# Patient Record
Sex: Male | Born: 1950 | Hispanic: Yes | Smoking: Never smoker
Health system: Southern US, Community
[De-identification: ages and names within clinical notes are randomized; demographics above are authoritative.]

## PROBLEM LIST (undated history)

## (undated) DIAGNOSIS — C801 Malignant (primary) neoplasm, unspecified: Secondary | ICD-10-CM

## (undated) DIAGNOSIS — K219 Gastro-esophageal reflux disease without esophagitis: Secondary | ICD-10-CM

## (undated) DIAGNOSIS — E039 Hypothyroidism, unspecified: Secondary | ICD-10-CM

## (undated) DIAGNOSIS — M1990 Unspecified osteoarthritis, unspecified site: Secondary | ICD-10-CM

## (undated) HISTORY — PX: PORTACATH PLACEMENT: SHX2246

---

## 2009-01-07 ENCOUNTER — Ambulatory Visit (HOSPITAL_COMMUNITY): Admission: RE | Admit: 2009-01-07 | Discharge: 2009-01-07 | Payer: Self-pay | Admitting: Otolaryngology

## 2009-01-14 ENCOUNTER — Ambulatory Visit: Admission: RE | Admit: 2009-01-14 | Discharge: 2009-04-05 | Payer: Self-pay | Admitting: Radiation Oncology

## 2009-01-29 ENCOUNTER — Ambulatory Visit: Payer: Self-pay | Admitting: Internal Medicine

## 2009-01-29 LAB — CBC WITH DIFFERENTIAL/PLATELET
BASO%: 1.1 % (ref 0.0–2.0)
Basophils Absolute: 0.1 10*3/uL (ref 0.0–0.1)
EOS%: 2.4 % (ref 0.0–7.0)
HCT: 44.7 % (ref 38.4–49.9)
HGB: 15.3 g/dL (ref 13.0–17.1)
MCH: 31.5 pg (ref 27.2–33.4)
MONO#: 0.8 10*3/uL (ref 0.1–0.9)
NEUT%: 37.9 % — ABNORMAL LOW (ref 39.0–75.0)
RDW: 14.1 % (ref 11.0–14.6)
WBC: 7.2 10*3/uL (ref 4.0–10.3)
lymph#: 3.4 10*3/uL — ABNORMAL HIGH (ref 0.9–3.3)

## 2009-01-29 LAB — COMPREHENSIVE METABOLIC PANEL
Alkaline Phosphatase: 133 U/L — ABNORMAL HIGH (ref 39–117)
CO2: 28 mEq/L (ref 19–32)
Creatinine, Ser: 0.66 mg/dL (ref 0.40–1.50)
Glucose, Bld: 89 mg/dL (ref 70–99)
Sodium: 131 mEq/L — ABNORMAL LOW (ref 135–145)
Total Bilirubin: 0.7 mg/dL (ref 0.3–1.2)
Total Protein: 8.6 g/dL — ABNORMAL HIGH (ref 6.0–8.3)

## 2009-01-29 LAB — LACTATE DEHYDROGENASE: LDH: 210 U/L (ref 94–250)

## 2009-02-04 LAB — COMPREHENSIVE METABOLIC PANEL
ALT: 140 U/L — ABNORMAL HIGH (ref 0–53)
AST: 136 U/L — ABNORMAL HIGH (ref 0–37)
Albumin: 3.4 g/dL — ABNORMAL LOW (ref 3.5–5.2)
Alkaline Phosphatase: 142 U/L — ABNORMAL HIGH (ref 39–117)
BUN: 18 mg/dL (ref 6–23)
CO2: 25 mEq/L (ref 19–32)
Calcium: 9.1 mg/dL (ref 8.4–10.5)
Chloride: 102 mEq/L (ref 96–112)
Creatinine, Ser: 0.74 mg/dL (ref 0.40–1.50)
Glucose, Bld: 79 mg/dL (ref 70–99)
Potassium: 4.8 mEq/L (ref 3.5–5.3)
Sodium: 138 mEq/L (ref 135–145)
Total Bilirubin: 0.5 mg/dL (ref 0.3–1.2)
Total Protein: 7.8 g/dL (ref 6.0–8.3)

## 2009-02-04 LAB — CBC WITH DIFFERENTIAL/PLATELET
Basophils Absolute: 0.1 10*3/uL (ref 0.0–0.1)
EOS%: 3 % (ref 0.0–7.0)
HGB: 13.5 g/dL (ref 13.0–17.1)
LYMPH%: 50.4 % — ABNORMAL HIGH (ref 14.0–49.0)
MCH: 32 pg (ref 27.2–33.4)
MONO#: 0.9 10*3/uL (ref 0.1–0.9)
MONO%: 11.9 % (ref 0.0–14.0)
NEUT%: 33.7 % — ABNORMAL LOW (ref 39.0–75.0)
RDW: 14.5 % (ref 11.0–14.6)

## 2009-02-04 LAB — MAGNESIUM: Magnesium: 2 mg/dL (ref 1.5–2.5)

## 2009-02-11 LAB — CBC WITH DIFFERENTIAL/PLATELET
EOS%: 3.7 % (ref 0.0–7.0)
Eosinophils Absolute: 0.2 10*3/uL (ref 0.0–0.5)
MCH: 31 pg (ref 27.2–33.4)
MCHC: 34.5 g/dL (ref 32.0–36.0)
MONO#: 1.2 10*3/uL — ABNORMAL HIGH (ref 0.1–0.9)
MONO%: 19.9 % — ABNORMAL HIGH (ref 0.0–14.0)
NEUT#: 2.2 10*3/uL (ref 1.5–6.5)
Platelets: 130 10*3/uL — ABNORMAL LOW (ref 140–400)
RBC: 4.23 10*6/uL (ref 4.20–5.82)
RDW: 14.4 % (ref 11.0–14.6)
lymph#: 2.4 10*3/uL (ref 0.9–3.3)

## 2009-02-11 LAB — COMPREHENSIVE METABOLIC PANEL
ALT: 150 U/L — ABNORMAL HIGH (ref 0–53)
Alkaline Phosphatase: 113 U/L (ref 39–117)
BUN: 20 mg/dL (ref 6–23)
Calcium: 8.8 mg/dL (ref 8.4–10.5)
Creatinine, Ser: 0.78 mg/dL (ref 0.40–1.50)
Total Bilirubin: 0.6 mg/dL (ref 0.3–1.2)

## 2009-02-18 LAB — CBC WITH DIFFERENTIAL/PLATELET
Basophils Absolute: 0.1 10*3/uL (ref 0.0–0.1)
EOS%: 1.9 % (ref 0.0–7.0)
HCT: 36.3 % — ABNORMAL LOW (ref 38.4–49.9)
HGB: 12.5 g/dL — ABNORMAL LOW (ref 13.0–17.1)
LYMPH%: 30.6 % (ref 14.0–49.0)
MCV: 91 fL (ref 79.3–98.0)
NEUT#: 3.4 10*3/uL (ref 1.5–6.5)
WBC: 7.5 10*3/uL (ref 4.0–10.3)
lymph#: 2.3 10*3/uL (ref 0.9–3.3)

## 2009-02-18 LAB — COMPREHENSIVE METABOLIC PANEL
ALT: 141 U/L — ABNORMAL HIGH (ref 0–53)
AST: 146 U/L — ABNORMAL HIGH (ref 0–37)
BUN: 24 mg/dL — ABNORMAL HIGH (ref 6–23)
Calcium: 9 mg/dL (ref 8.4–10.5)
Chloride: 100 mEq/L (ref 96–112)
Creatinine, Ser: 0.75 mg/dL (ref 0.40–1.50)
Glucose, Bld: 73 mg/dL (ref 70–99)
Sodium: 133 mEq/L — ABNORMAL LOW (ref 135–145)

## 2009-02-18 LAB — MAGNESIUM: Magnesium: 1.8 mg/dL (ref 1.5–2.5)

## 2009-02-25 LAB — COMPREHENSIVE METABOLIC PANEL
Albumin: 3.2 g/dL — ABNORMAL LOW (ref 3.5–5.2)
Alkaline Phosphatase: 116 U/L (ref 39–117)
BUN: 22 mg/dL (ref 6–23)
CO2: 24 mEq/L (ref 19–32)
Creatinine, Ser: 0.74 mg/dL (ref 0.40–1.50)
Glucose, Bld: 144 mg/dL — ABNORMAL HIGH (ref 70–99)
Total Bilirubin: 0.6 mg/dL (ref 0.3–1.2)

## 2009-02-25 LAB — CBC WITH DIFFERENTIAL/PLATELET
Eosinophils Absolute: 0.1 10*3/uL (ref 0.0–0.5)
MONO#: 0.7 10*3/uL (ref 0.1–0.9)
Platelets: 109 10*3/uL — ABNORMAL LOW (ref 140–400)
RBC: 3.55 10*6/uL — ABNORMAL LOW (ref 4.20–5.82)
RDW: 14.6 % (ref 11.0–14.6)
WBC: 4 10*3/uL (ref 4.0–10.3)

## 2009-02-26 ENCOUNTER — Ambulatory Visit: Payer: Self-pay | Admitting: Internal Medicine

## 2009-03-04 LAB — COMPREHENSIVE METABOLIC PANEL
BUN: 23 mg/dL (ref 6–23)
CO2: 25 mEq/L (ref 19–32)
Calcium: 8.7 mg/dL (ref 8.4–10.5)
Chloride: 99 mEq/L (ref 96–112)
Creatinine, Ser: 0.77 mg/dL (ref 0.40–1.50)
Glucose, Bld: 163 mg/dL — ABNORMAL HIGH (ref 70–99)
Total Bilirubin: 0.7 mg/dL (ref 0.3–1.2)

## 2009-03-04 LAB — MAGNESIUM: Magnesium: 1.4 mg/dL — ABNORMAL LOW (ref 1.5–2.5)

## 2009-03-04 LAB — CBC WITH DIFFERENTIAL/PLATELET
Basophils Absolute: 0 10*3/uL (ref 0.0–0.1)
Eosinophils Absolute: 0 10*3/uL (ref 0.0–0.5)
HCT: 31.1 % — ABNORMAL LOW (ref 38.4–49.9)
HGB: 10.8 g/dL — ABNORMAL LOW (ref 13.0–17.1)
LYMPH%: 25.7 % (ref 14.0–49.0)
MCHC: 34.7 g/dL (ref 32.0–36.0)
MONO#: 0.5 10*3/uL (ref 0.1–0.9)
NEUT#: 2 10*3/uL (ref 1.5–6.5)
NEUT%: 59.5 % (ref 39.0–75.0)
Platelets: 94 10*3/uL — ABNORMAL LOW (ref 140–400)
WBC: 3.4 10*3/uL — ABNORMAL LOW (ref 4.0–10.3)
lymph#: 0.9 10*3/uL (ref 0.9–3.3)

## 2009-03-11 LAB — COMPREHENSIVE METABOLIC PANEL
BUN: 19 mg/dL (ref 6–23)
CO2: 28 mEq/L (ref 19–32)
Chloride: 98 mEq/L (ref 96–112)
Creatinine, Ser: 0.74 mg/dL (ref 0.40–1.50)
Glucose, Bld: 96 mg/dL (ref 70–99)
Potassium: 4.9 mEq/L (ref 3.5–5.3)

## 2009-03-11 LAB — CBC WITH DIFFERENTIAL/PLATELET
Basophils Absolute: 0 10*3/uL (ref 0.0–0.1)
HCT: 30.6 % — ABNORMAL LOW (ref 38.4–49.9)
HGB: 10.9 g/dL — ABNORMAL LOW (ref 13.0–17.1)
LYMPH%: 32.3 % (ref 14.0–49.0)
MCH: 33.8 pg — ABNORMAL HIGH (ref 27.2–33.4)
MCHC: 35.6 g/dL (ref 32.0–36.0)
MONO#: 0.4 10*3/uL (ref 0.1–0.9)
NEUT#: 1.4 10*3/uL — ABNORMAL LOW (ref 1.5–6.5)
Platelets: 111 10*3/uL — ABNORMAL LOW (ref 140–400)
RBC: 3.23 10*6/uL — ABNORMAL LOW (ref 4.20–5.82)

## 2009-03-18 LAB — CBC WITH DIFFERENTIAL/PLATELET
EOS%: 1.8 % (ref 0.0–7.0)
HCT: 31.6 % — ABNORMAL LOW (ref 38.4–49.9)
HGB: 10.8 g/dL — ABNORMAL LOW (ref 13.0–17.1)
MCHC: 34.2 g/dL (ref 32.0–36.0)
MCV: 94.3 fL (ref 79.3–98.0)
MONO%: 27.1 % — ABNORMAL HIGH (ref 0.0–14.0)
NEUT#: 0.6 10*3/uL — ABNORMAL LOW (ref 1.5–6.5)
NEUT%: 35.6 % — ABNORMAL LOW (ref 39.0–75.0)
Platelets: 123 10*3/uL — ABNORMAL LOW (ref 140–400)
RBC: 3.35 10*6/uL — ABNORMAL LOW (ref 4.20–5.82)
WBC: 1.7 10*3/uL — ABNORMAL LOW (ref 4.0–10.3)
lymph#: 0.6 10*3/uL — ABNORMAL LOW (ref 0.9–3.3)

## 2009-03-18 LAB — COMPREHENSIVE METABOLIC PANEL
BUN: 20 mg/dL (ref 6–23)
CO2: 27 mEq/L (ref 19–32)
Calcium: 8.9 mg/dL (ref 8.4–10.5)
Potassium: 5 mEq/L (ref 3.5–5.3)
Total Protein: 6.7 g/dL (ref 6.0–8.3)

## 2009-03-20 LAB — CBC WITH DIFFERENTIAL/PLATELET
BASO%: 0.6 % (ref 0.0–2.0)
Basophils Absolute: 0 10*3/uL (ref 0.0–0.1)
Eosinophils Absolute: 0.1 10*3/uL (ref 0.0–0.5)
HGB: 11.9 g/dL — ABNORMAL LOW (ref 13.0–17.1)
MCHC: 34.3 g/dL (ref 32.0–36.0)
MCV: 95.1 fL (ref 79.3–98.0)
MONO#: 1.7 10*3/uL — ABNORMAL HIGH (ref 0.1–0.9)
MONO%: 25.7 % — ABNORMAL HIGH (ref 0.0–14.0)
NEUT#: 3.3 10*3/uL (ref 1.5–6.5)
Platelets: 149 10*3/uL (ref 140–400)
RDW: 17.3 % — ABNORMAL HIGH (ref 11.0–14.6)
WBC: 6.5 10*3/uL (ref 4.0–10.3)
lymph#: 1.4 10*3/uL (ref 0.9–3.3)

## 2009-04-08 ENCOUNTER — Ambulatory Visit: Admission: RE | Admit: 2009-04-08 | Discharge: 2009-04-12 | Payer: Self-pay | Admitting: Radiation Oncology

## 2009-05-27 ENCOUNTER — Ambulatory Visit (HOSPITAL_COMMUNITY): Admission: RE | Admit: 2009-05-27 | Discharge: 2009-05-27 | Payer: Self-pay | Admitting: Internal Medicine

## 2009-05-27 ENCOUNTER — Ambulatory Visit: Payer: Self-pay | Admitting: Internal Medicine

## 2009-05-29 LAB — CBC WITH DIFFERENTIAL/PLATELET
Basophils Absolute: 0 10*3/uL (ref 0.0–0.1)
HCT: 40.3 % (ref 38.4–49.9)
HGB: 13.6 g/dL (ref 13.0–17.1)
LYMPH%: 33.3 % (ref 14.0–49.0)
MCH: 32.9 pg (ref 27.2–33.4)
MCHC: 33.7 g/dL (ref 32.0–36.0)
MCV: 97.6 fL (ref 79.3–98.0)
MONO#: 0.8 10*3/uL (ref 0.1–0.9)
MONO%: 17.4 % — ABNORMAL HIGH (ref 0.0–14.0)
WBC: 4.4 10*3/uL (ref 4.0–10.3)
nRBC: 0 % (ref 0–0)

## 2009-05-29 LAB — COMPREHENSIVE METABOLIC PANEL
ALT: 109 U/L — ABNORMAL HIGH (ref 0–53)
AST: 115 U/L — ABNORMAL HIGH (ref 0–37)
Calcium: 9.1 mg/dL (ref 8.4–10.5)
Creatinine, Ser: 0.67 mg/dL (ref 0.40–1.50)
Glucose, Bld: 131 mg/dL — ABNORMAL HIGH (ref 70–99)
Total Bilirubin: 0.4 mg/dL (ref 0.3–1.2)

## 2009-06-26 ENCOUNTER — Ambulatory Visit (HOSPITAL_COMMUNITY): Admission: RE | Admit: 2009-06-26 | Discharge: 2009-06-26 | Payer: Self-pay | Admitting: Radiation Oncology

## 2009-08-21 ENCOUNTER — Ambulatory Visit: Payer: Self-pay | Admitting: Internal Medicine

## 2009-09-26 ENCOUNTER — Ambulatory Visit: Payer: Self-pay | Admitting: Internal Medicine

## 2009-11-14 ENCOUNTER — Ambulatory Visit: Payer: Self-pay | Admitting: Internal Medicine

## 2011-02-27 IMAGING — CT CT NECK W/ CM
4 of 6 series · 16 of 33 positions shown, 18 images · IV contrast (agent unspecified)
Comparison: Noncontrast CT images from previous PET scan on
01/07/2009

CT NECK

CLINICAL DATA: Follow-up tonsillar carcinoma.  Status post
chemotherapy and radiation therapy.  Neck pain and dysphagia.

CT NECK AND CHEST WITH CONTRAST
TECHNIQUE: Multidetector CT imaging of the neck and chest was
performed using the standard protocol after bolus administration of
intravenous contrast.
Contrast:  100 ml [id number]

[Series 3: chest st · axial · 0.56mm/px · z∈[+986,+1226]mm · 5 of 73 slices shown, 7 images]
[im 13/73  soft-tissue]
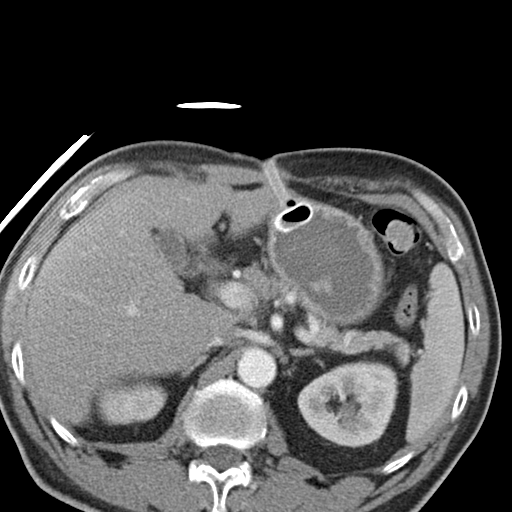
[im 13/73  bone]
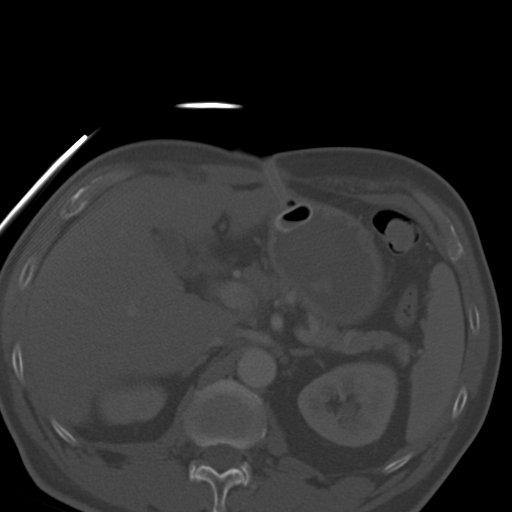
[im 25/73  bone]
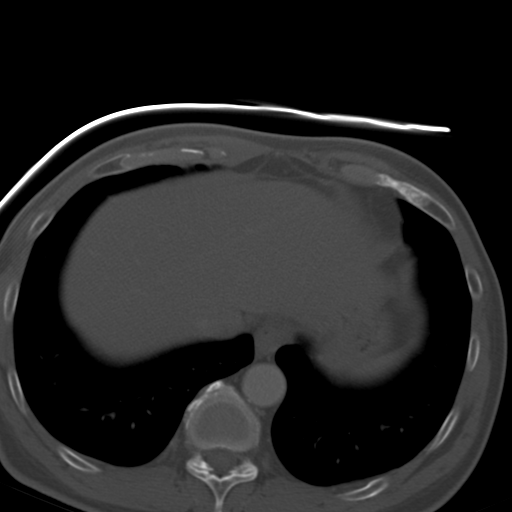
[im 37/73  bone]
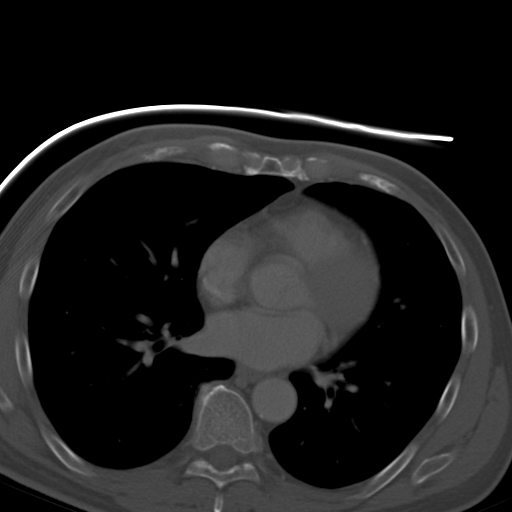
[im 49/73  bone]
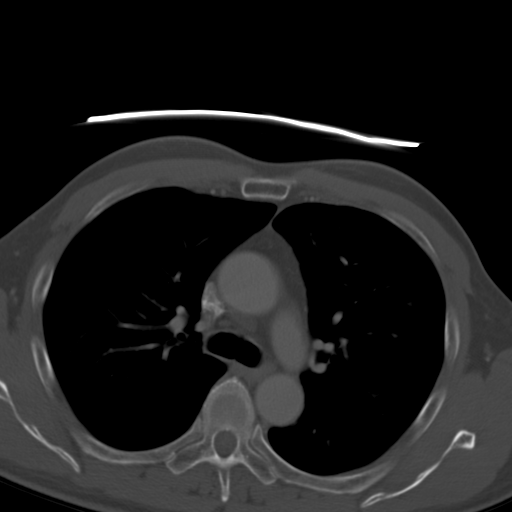
[im 61/73  soft-tissue]
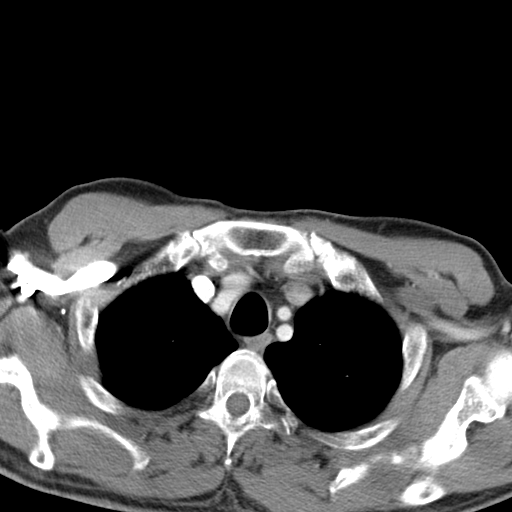
[im 61/73  bone]
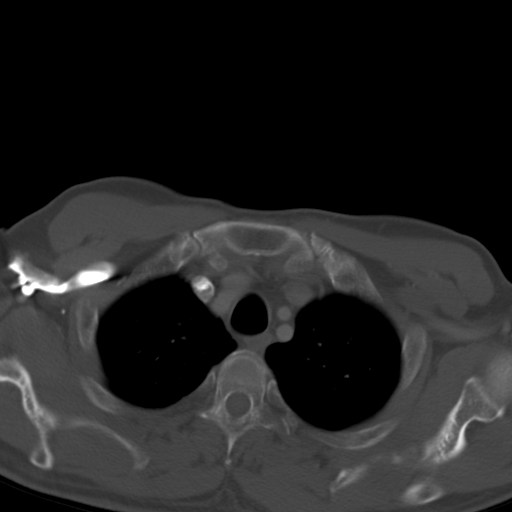

[Series 7: neck st · axial · 0.39mm/px · z∈[+1237,+1393]mm · 5 of 78 slices shown]
[im 13/78  bone]
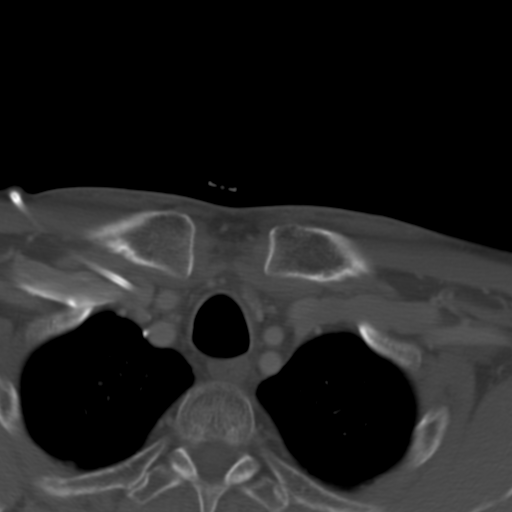
[im 26/78  bone]
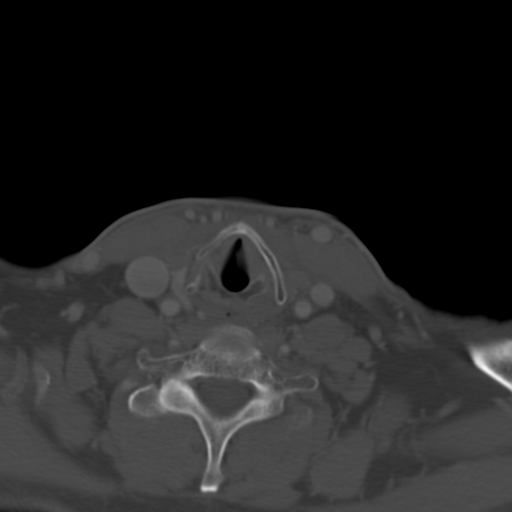
[im 39/78  bone]
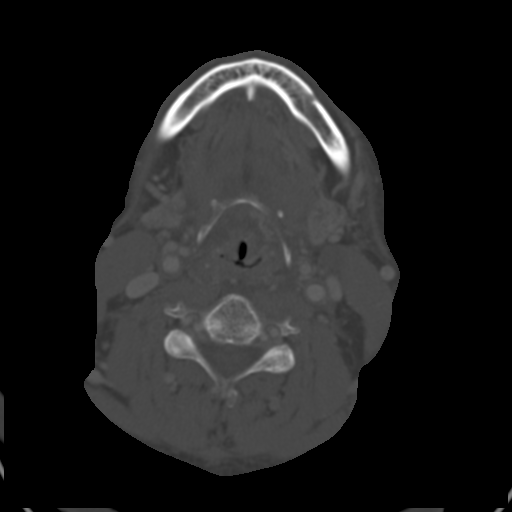
[im 52/78  bone]
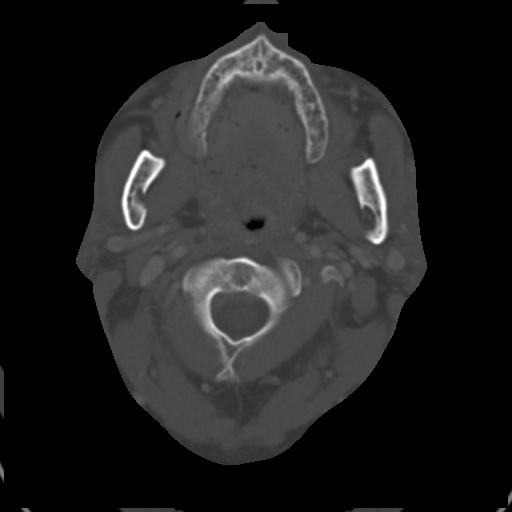
[im 65/78  bone]
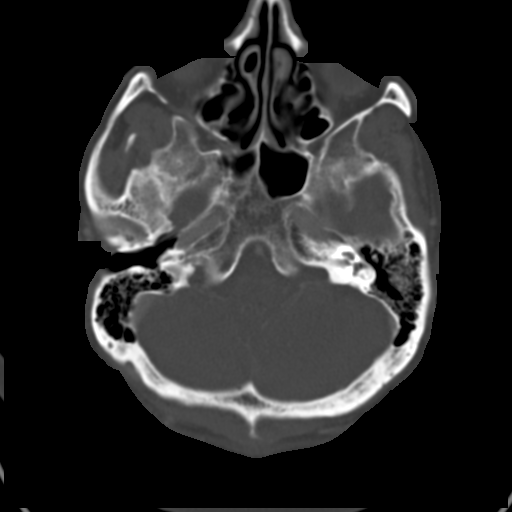

[Series 602: <mpr thick range> · coronal · 0.46mm/px · 1 of 66 slices shown]
[im 33/66  bone]
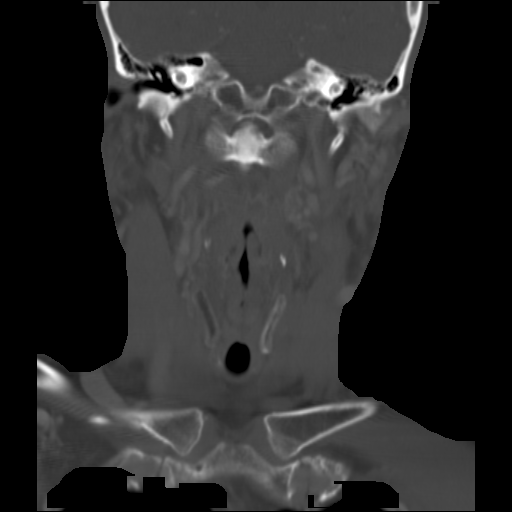

[Series 605: <mpr thick range(3)> · sagittal · 0.71mm/px · 5 of 91 slices shown]
[im 16/91  bone]
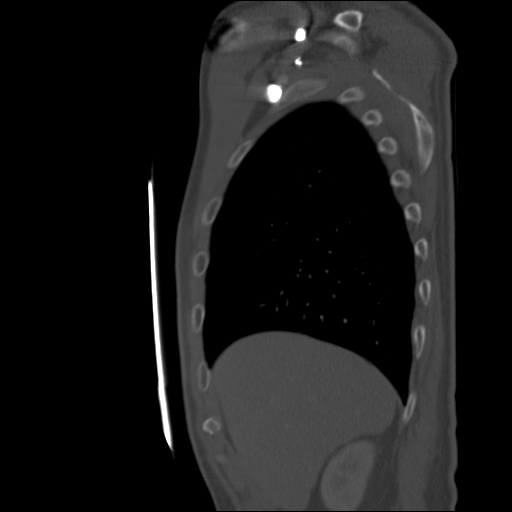
[im 31/91  bone]
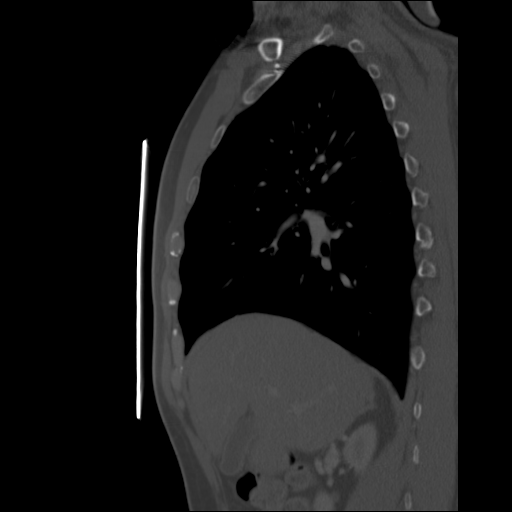
[im 46/91  bone]
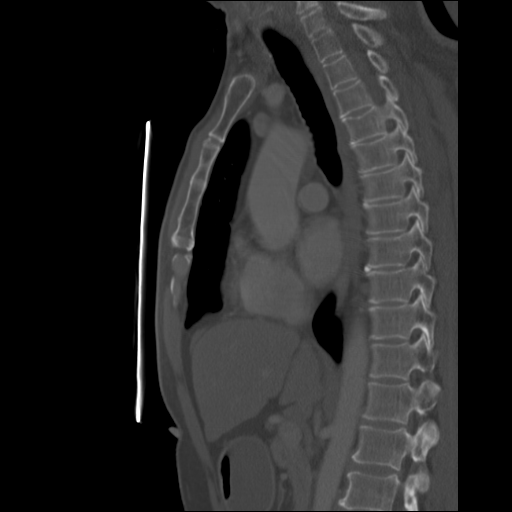
[im 61/91  bone]
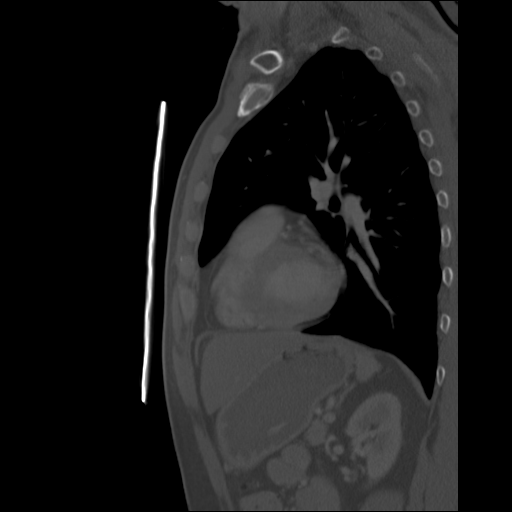
[im 76/91  bone]
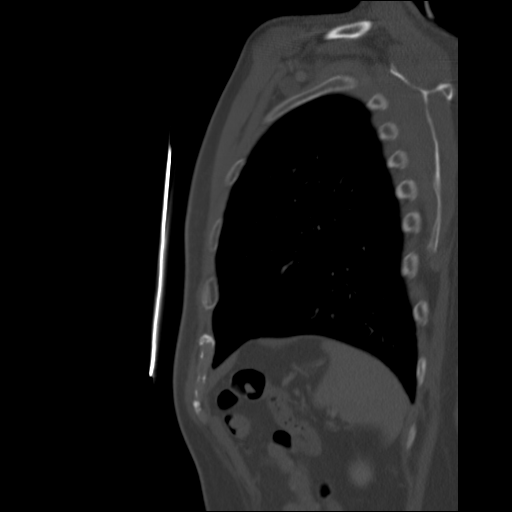

[16 of 33 positions shown; findings below may reference images not displayed]

FINDINGS: Marked decrease in size of left tonsillar soft tissue
mass is seen since prior study, which now measures approximately
1.9 x 2.2 cm compared with 4.5 x 5.8 cm on prior study.  Mild
residual adenopathy seen in the left jugulodigastric region
measuring 1.2 x 1.3 cm.  No other areas of adenopathy are seen
within the neck.

Symmetric thickening of the vocal cords and epiglottis is seen,
consistent with the prior radiation therapy.  Salivary glands and
thyroid are normal in appearance.
IMPRESSION: Marked interval decrease in size of left tonsillar soft tissue
mass, and mild residual adenopathy in the left jugulodigastric
region.

CT CHEST
FINDINGS: No lymphadenopathy or other soft tissue masses are
identified within the thorax.  There is no evidence of pleural or
pericardial effusion.  No suspicious pulmonary nodules or masses
are identified.  There is no evidence of pulmonary infiltrate.  No
suspicious bone lesions are seen in place.  Percutaneous
gastrostomy tube is noted.
IMPRESSION: No evidence of metastatic disease or other acute findings within
the thorax.

## 2015-10-03 ENCOUNTER — Other Ambulatory Visit (HOSPITAL_COMMUNITY): Payer: Self-pay | Admitting: Gastroenterology

## 2015-10-03 DIAGNOSIS — K7460 Unspecified cirrhosis of liver: Principal | ICD-10-CM

## 2015-10-03 DIAGNOSIS — B182 Chronic viral hepatitis C: Secondary | ICD-10-CM

## 2015-10-25 ENCOUNTER — Ambulatory Visit (HOSPITAL_COMMUNITY): Payer: Medicare Other

## 2015-11-08 ENCOUNTER — Ambulatory Visit (HOSPITAL_COMMUNITY)
Admission: RE | Admit: 2015-11-08 | Discharge: 2015-11-08 | Disposition: A | Discharge status: Home / Self Care | Payer: Medicare Other | Source: Ambulatory Visit | Attending: Gastroenterology | Admitting: Gastroenterology

## 2015-11-08 DIAGNOSIS — K8020 Calculus of gallbladder without cholecystitis without obstruction: Secondary | ICD-10-CM | POA: Insufficient documentation

## 2015-11-08 DIAGNOSIS — B182 Chronic viral hepatitis C: Secondary | ICD-10-CM | POA: Insufficient documentation

## 2015-11-08 DIAGNOSIS — K7460 Unspecified cirrhosis of liver: Secondary | ICD-10-CM

## 2015-11-08 DIAGNOSIS — K7469 Other cirrhosis of liver: Secondary | ICD-10-CM | POA: Insufficient documentation

## 2015-11-08 DIAGNOSIS — R932 Abnormal findings on diagnostic imaging of liver and biliary tract: Secondary | ICD-10-CM | POA: Diagnosis not present

## 2017-03-03 ENCOUNTER — Ambulatory Visit (INDEPENDENT_AMBULATORY_CARE_PROVIDER_SITE_OTHER): Payer: Medicare Other | Admitting: Urology

## 2017-03-03 DIAGNOSIS — N471 Phimosis: Secondary | ICD-10-CM

## 2017-03-24 ENCOUNTER — Other Ambulatory Visit: Payer: Self-pay | Admitting: Urology

## 2017-04-01 NOTE — Patient Instructions (Addendum)
Jerry Flynn  04/01/2017     @PREFPERIOPPHARMACY @   Your procedure is scheduled on  04/12/2017.  Report to [facility] at  800  A.M.  Call this number if you have problems the morning of surgery:  [phone]   Remember:  Do not eat food or drink liquids after midnight.  Take these medicines the morning of surgery with A SIP OF WATER Norvasc, neurontin, levothyroxine, protonix.   Do not wear jewelry, make-up or nail polish.  Do not wear lotions, powders, or perfumes, or deodorant.  Do not shave 48 hours prior to surgery.  Men may shave face and neck.  Do not bring valuables to the hospital.  [facility] is not responsible for any belongings or valuables.  Contacts, dentures or bridgework may not be worn into surgery.  Leave your suitcase in the car.  After surgery it may be brought to your room.  For patients admitted to the hospital, discharge time will be determined by your treatment team.  Patients discharged the day of surgery will not be allowed to drive home.   Name and phone number of your driver:   family Special instructions:  None  Please read over the following fact sheets that you were given. Anesthesia Post-op Instructions and Care and Recovery After Surgery       Circumcision, Adult Circumcision is a surgery to remove the foreskin of the penis or to cut the foreskin so the space between skin and the tip of the penis is larger. When only the foreskin is cut, it is called a dorsal incision. A dorsal circumcision leaves the entire foreskin but makes the end of the foreskin looser so it can be pulled back over the head of the penis. Tell a health care provider about:  Any allergies you have.  All medicines you are taking, including vitamins, herbs, eye drops, creams, and over-the-counter medicines.  Any problems you or family members have had with anesthetic medicines.  Any blood disorders you have.  Any surgeries you have had.  Any  medical conditions you have, including a cold or other infection. What are the risks? Generally, this is a safe procedure. However, problems may occur, including:  Bleeding.  Infection.  Pain.  Urethral injury. The urethra is a tube that ends at the tip of the penis and carries urine out of your body.  Opening of the surgical wound. This can occur from an unwanted erection after surgery.  Allergic reactions to medicines.  What happens before the procedure? Staying hydrated Follow instructions from your health care provider about hydration, which may include:  Up to 2 hours before the procedure - you may continue to drink clear liquids, such as water, clear fruit juice, black coffee, and plain tea.  Eating and drinking restrictions Follow instructions from your health care provider about eating and drinking, which may include:  8 hours before the procedure - stop eating heavy meals or foods such as meat, fried foods, or fatty foods.  6 hours before the procedure - stop eating light meals or foods, such as toast or cereal.  6 hours before the procedure - stop drinking milk or drinks that contain milk.  2 hours before the procedure - stop drinking clear liquids.  Medicines  Ask your health care provider about: ? Changing or stopping your regular medicines. This is especially important if you take diabetes medicines or blood thinners. ? Taking medicines such as aspirin  and ibuprofen. These medicines can thin your blood. Do not take these medicines before your procedure if your doctor instructs you not to.  You may be given antibiotic medicine to help prevent infection. General instructions  If you will be going home right after the procedure, plan to have someone with you for 24 hours.  Plan to have someone take you home from the hospital or clinic.  Ask your health care provider how your surgical site will be marked or identified.  You may be asked to shower with a  germ-killing soap. What happens during the procedure?  To reduce your risk of infection: ? Your health care team will wash or sanitize their hands. ? Your skin will be washed with soap.  An IV tube may be inserted into one of your veins.  You may be given medicine to help you relax (sedative).  An anesthetic will be injected with a needle into the skin of your penis (local anesthetic) to numb the nerves of your foreskin.  An incision will be made to remove or cut the foreskin.  Absorbable stitches (sutures) may be used to close the incision.  A bandage (dressing) will be applied to the incision site. The procedure may vary among health care providers and hospitals. What happens after the procedure?  Your blood pressure, heart rate, breathing rate, and blood oxygen level will be monitored until the medicines you were given have worn off.  Do not get out of bed until your health care provider approves.  Do not drive for 24 hours after the procedure if you were given a sedative. Summary  Circumcision is a surgery to remove the foreskin of the penis or to cut the foreskin so the opening is larger.  Absorbable sutures may be used to close the incision after the foreskin has been removed or cut.  If you will be going home right after the procedure, plan to have someone with you for 24 hours. This information is not intended to replace advice given to you by your health care provider. Make sure you discuss any questions you have with your health care provider. Document Released: 04/12/2007 Document Revised: 02/10/2016 Document Reviewed: 02/10/2016 Elsevier Interactive Patient Education  2017 [place].  Circumcision, Adult, Care After This sheet gives you information about how to care for yourself after your procedure. Your doctor may also give you more specific instructions. If you have problems or questions, contact your doctor. Follow these instructions at home: Cut  care  Follow instructions from your doctor about how to take care of your cut from surgery (incision). Make sure you: ? Wash your hands with soap and water before you change your bandage (dressing). If you cannot use soap and water, use hand sanitizer. ? Change your bandage as told by your doctor. ? Leave stitches (sutures), skin glue, or skin tape (adhesive) strips in place. They may need to stay in place for 2 weeks or longer. If tape strips get loose and curl up, you may trim the loose edges. Do not remove tape strips completely unless your doctor says it is okay  Check your cut area every day for signs of infection. Check for: ? More redness, swelling, or pain. ? More fluid or blood. ? Warmth. ? Pus or a bad smell. Bathing  Do not take baths, swim, or use a hot tub until your doctor says it is okay. Ask your doctor if you can take showers. You may only be allowed to take sponge  baths for bathing.  Do not get your cut area wet during the 24 hours after the procedure, or as long as told by your doctor.  When you can shower, do not rub the cut area. Gently pat it dry. General instructions  Avoid heavy lifting, contact sports, biking, or swimming until your doctor says it is okay.  Do not have sex until your doctor says it is okay.  Take or apply over-the-counter and prescription medicines only as told by your doctor.  Keep all follow-up visits as told by your doctor. This is important. Contact a doctor if:  Medicine does not help your pain.  You have more redness, swelling, or pain around your cut.  You have more fluid or blood coming from your cut.  Your cut feels warm to the touch.  You have pus or a bad smell coming from your cut.  You have a fever. Get help right away if:  You cannot pee (urinate).  It hurts to pee.  Your pain is not helped by medicines.  There is redness, swelling, and soreness that spreads up the shaft of your penis, your thighs, or your lower  belly (abdomen).  You have bleeding that does not stop when you press on it. Summary  Follow instructions from your doctor about how to take care of your cut from surgery (incision).  Check your cut area every day for signs of infection.  Do not have sex until your doctor says it is okay. This information is not intended to replace advice given to you by your health care provider. Make sure you discuss any questions you have with your health care provider. Document Released: 09/09/2007 Document Revised: 03/31/2016 Document Reviewed: 03/31/2016 Elsevier Interactive Patient Education  2017 [place] Anesthesia, Adult General anesthesia is the use of medicines to make a person "go to sleep" (be unconscious) for a medical procedure. General anesthesia is often recommended when a procedure:  Is long.  Requires you to be still or in an unusual position.  Is major and can cause you to lose blood.  Is impossible to do without general anesthesia.  The medicines used for general anesthesia are called general anesthetics. In addition to making you sleep, the medicines:  Prevent pain.  Control your blood pressure.  Relax your muscles.  Tell a health care provider about:  Any allergies you have.  All medicines you are taking, including vitamins, herbs, eye drops, creams, and over-the-counter medicines.  Any problems you or family members have had with anesthetic medicines.  Types of anesthetics you have had in the past.  Any bleeding disorders you have.  Any surgeries you have had.  Any medical conditions you have.  Any history of heart or lung conditions, such as heart failure, sleep apnea, or chronic obstructive pulmonary disease (COPD).  Whether you are pregnant or may be pregnant.  Whether you use tobacco, alcohol, marijuana, or street drugs.  Any history of Armed forces logistics/support/administrative officer.  Any history of depression or anxiety. What are the risks? Generally, this is  a safe procedure. However, problems may occur, including:  Allergic reaction to anesthetics.  Lung and heart problems.  Inhaling food or liquids from your stomach into your lungs (aspiration).  Injury to nerves.  Waking up during your procedure and being unable to move (rare).  Extreme agitation or a state of mental confusion (delirium) when you wake up from the anesthetic.  Air in the bloodstream, which can lead to stroke.  These problems  are more likely to develop if you are having a major surgery or if you have an advanced medical condition. You can prevent some of these complications by answering all of your health care provider's questions thoroughly and by following all pre-procedure instructions. General anesthesia can cause side effects, including:  Nausea or vomiting  A sore throat from the breathing tube.  Feeling cold or shivery.  Feeling tired, washed out, or achy.  Sleepiness or drowsiness.  Confusion or agitation.  What happens before the procedure? Staying hydrated Follow instructions from your health care provider about hydration, which may include:  Up to 2 hours before the procedure - you may continue to drink clear liquids, such as water, clear fruit juice, black coffee, and plain tea.  Eating and drinking restrictions Follow instructions from your health care provider about eating and drinking, which may include:  8 hours before the procedure - stop eating heavy meals or foods such as meat, fried foods, or fatty foods.  6 hours before the procedure - stop eating light meals or foods, such as toast or cereal.  6 hours before the procedure - stop drinking milk or drinks that contain milk.  2 hours before the procedure - stop drinking clear liquids.  Medicines  Ask your health care provider about: ? Changing or stopping your regular medicines. This is especially important if you are taking diabetes medicines or blood thinners. ? Taking medicines  such as aspirin and ibuprofen. These medicines can thin your blood. Do not take these medicines before your procedure if your health care provider instructs you not to. ? Taking new dietary supplements or medicines. Do not take these during the week before your procedure unless your health care provider approves them.  If you are told to take a medicine or to continue taking a medicine on the day of the procedure, take the medicine with sips of water. General instructions   Ask if you will be going home the same day, the following day, or after a longer hospital stay. ? Plan to have someone take you home. ? Plan to have someone stay with you for the first 24 hours after you leave the hospital or clinic.  For 3-6 weeks before the procedure, try not to use any tobacco products, such as cigarettes, chewing tobacco, and e-cigarettes.  You may brush your teeth on the morning of the procedure, but make sure to spit out the toothpaste. What happens during the procedure?  You will be given anesthetics through a mask and through an IV tube in one of your veins.  You may receive medicine to help you relax (sedative).  As soon as you are asleep, a breathing tube may be used to help you breathe.  An anesthesia specialist will stay with you throughout the procedure. He or she will help keep you comfortable and safe by continuing to give you medicines and adjusting the amount of medicine that you get. He or she will also watch your blood pressure, pulse, and oxygen levels to make sure that the anesthetics do not cause any problems.  If a breathing tube was used to help you breathe, it will be removed before you wake up. The procedure may vary among health care providers and hospitals. What happens after the procedure?  You will wake up, often slowly, after the procedure is complete, usually in a recovery area.  Your blood pressure, heart rate, breathing rate, and blood oxygen level will be monitored  until the medicines you  were given have worn off.  You may be given medicine to help you calm down if you feel anxious or agitated.  If you will be going home the same day, your health care provider may check to make sure you can stand, drink, and urinate.  Your health care providers will treat your pain and side effects before you go home.  Do not drive for 24 hours if you received a sedative.  You may: ? Feel nauseous and vomit. ? Have a sore throat. ? Have mental slowness. ? Feel cold or shivery. ? Feel sleepy. ? Feel tired. ? Feel sore or achy, even in parts of your body where you did not have surgery. This information is not intended to replace advice given to you by your health care provider. Make sure you discuss any questions you have with your health care provider. Document Released: 06/30/2007 Document Revised: 09/03/2015 Document Reviewed: 03/07/2015 Elsevier Interactive Patient Education  2018 [place] Anesthesia, Adult, Care After These instructions provide you with information about caring for yourself after your procedure. Your health care provider may also give you more specific instructions. Your treatment has been planned according to current medical practices, but problems sometimes occur. Call your health care provider if you have any problems or questions after your procedure. What can I expect after the procedure? After the procedure, it is common to have:  Vomiting.  A sore throat.  Mental slowness.  It is common to feel:  Nauseous.  Cold or shivery.  Sleepy.  Tired.  Sore or achy, even in parts of your body where you did not have surgery.  Follow these instructions at home: For at least 24 hours after the procedure:  Do not: ? Participate in activities where you could fall or become injured. ? Drive. ? Use heavy machinery. ? Drink alcohol. ? Take sleeping pills or medicines that cause drowsiness. ? Make important decisions or  sign legal documents. ? Take care of children on your own.  Rest. Eating and drinking  If you vomit, drink water, juice, or soup when you can drink without vomiting.  Drink enough fluid to keep your urine clear or pale yellow.  Make sure you have little or no nausea before eating solid foods.  Follow the diet recommended by your health care provider. General instructions  Have a responsible adult stay with you until you are awake and alert.  Return to your normal activities as told by your health care provider. Ask your health care provider what activities are safe for you.  Take over-the-counter and prescription medicines only as told by your health care provider.  If you smoke, do not smoke without supervision.  Keep all follow-up visits as told by your health care provider. This is important. Contact a health care provider if:  You continue to have nausea or vomiting at home, and medicines are not helpful.  You cannot drink fluids or start eating again.  You cannot urinate after 8-12 hours.  You develop a skin rash.  You have fever.  You have increasing redness at the site of your procedure. Get help right away if:  You have difficulty breathing.  You have chest pain.  You have unexpected bleeding.  You feel that you are having a life-threatening or urgent problem. This information is not intended to replace advice given to you by your health care provider. Make sure you discuss any questions you have with your health care provider. Document Released: 06/29/2000 Document Revised:  08/26/2015 Document Reviewed: 03/07/2015 Elsevier Interactive Patient Education  [employer].

## 2017-04-08 ENCOUNTER — Encounter (HOSPITAL_COMMUNITY)
Admission: RE | Admit: 2017-04-08 | Discharge: 2017-04-08 | Disposition: A | Discharge status: Home / Self Care | Payer: Medicare Other | Source: Ambulatory Visit | Attending: Urology | Admitting: Urology

## 2017-04-08 ENCOUNTER — Other Ambulatory Visit: Payer: Self-pay

## 2017-04-08 ENCOUNTER — Encounter (HOSPITAL_COMMUNITY): Payer: Self-pay

## 2017-04-08 DIAGNOSIS — Z01812 Encounter for preprocedural laboratory examination: Secondary | ICD-10-CM | POA: Diagnosis not present

## 2017-04-08 DIAGNOSIS — Z01810 Encounter for preprocedural cardiovascular examination: Secondary | ICD-10-CM | POA: Insufficient documentation

## 2017-04-08 HISTORY — DX: Hypothyroidism, unspecified: E03.9

## 2017-04-08 HISTORY — DX: Malignant (primary) neoplasm, unspecified: C80.1

## 2017-04-08 HISTORY — DX: Gastro-esophageal reflux disease without esophagitis: K21.9

## 2017-04-08 HISTORY — DX: Unspecified osteoarthritis, unspecified site: M19.90

## 2017-04-08 LAB — BASIC METABOLIC PANEL
ANION GAP: 13 (ref 5–15)
BUN: 16 mg/dL (ref 6–20)
CALCIUM: 10 mg/dL (ref 8.9–10.3)
CO2: 23 mmol/L (ref 22–32)
Chloride: 103 mmol/L (ref 101–111)
Creatinine, Ser: 0.8 mg/dL (ref 0.61–1.24)
GFR calc Af Amer: >60 mL/min (ref >60)
Glucose, Bld: 134 mg/dL — ABNORMAL HIGH (ref 65–99)
Potassium: 3.6 mmol/L (ref 3.5–5.1)
Sodium: 139 mmol/L (ref 135–145)

## 2017-04-08 LAB — CBC WITH DIFFERENTIAL/PLATELET
BASOS ABS: 0.1 10*3/uL (ref 0.0–0.1)
Basophils Relative: 1 %
EOS ABS: 0.2 10*3/uL (ref 0.0–0.7)
EOS PCT: 2 %
HCT: 45 % (ref 39.0–52.0)
Hemoglobin: 15 g/dL (ref 13.0–17.0)
LYMPHS PCT: 61 %
Lymphs Abs: 5.3 10*3/uL — ABNORMAL HIGH (ref 0.7–4.0)
MCH: 29.3 pg (ref 26.0–34.0)
MCHC: 33.3 g/dL (ref 30.0–36.0)
MCV: 87.9 fL (ref 78.0–100.0)
MONO ABS: 0.8 10*3/uL (ref 0.1–1.0)
Monocytes Relative: 9 %
NEUTROS PCT: 27 %
Neutro Abs: 2.4 10*3/uL (ref 1.7–7.7)
PLATELETS: 168 10*3/uL (ref 150–400)
RBC: 5.12 MIL/uL (ref 4.22–5.81)
RDW: 14.5 % (ref 11.5–15.5)
WBC: 8.8 10*3/uL (ref 4.0–10.5)

## 2017-04-12 ENCOUNTER — Encounter (HOSPITAL_COMMUNITY)
Admission: RE | Disposition: A | Discharge status: Home / Self Care | Payer: Self-pay | Source: Ambulatory Visit | Attending: Urology

## 2017-04-12 ENCOUNTER — Ambulatory Visit (HOSPITAL_COMMUNITY)
Admission: RE | Admit: 2017-04-12 | Discharge: 2017-04-12 | Disposition: A | Discharge status: Home / Self Care | Payer: Medicare Other | Source: Ambulatory Visit | Attending: Urology | Admitting: Urology

## 2017-04-12 ENCOUNTER — Encounter (HOSPITAL_COMMUNITY): Payer: Self-pay

## 2017-04-12 ENCOUNTER — Ambulatory Visit (HOSPITAL_COMMUNITY): Payer: Medicare Other | Admitting: Anesthesiology

## 2017-04-12 DIAGNOSIS — Z8572 Personal history of non-Hodgkin lymphomas: Secondary | ICD-10-CM | POA: Diagnosis not present

## 2017-04-12 DIAGNOSIS — N4889 Other specified disorders of penis: Secondary | ICD-10-CM

## 2017-04-12 DIAGNOSIS — I10 Essential (primary) hypertension: Secondary | ICD-10-CM | POA: Insufficient documentation

## 2017-04-12 DIAGNOSIS — Z79899 Other long term (current) drug therapy: Secondary | ICD-10-CM | POA: Insufficient documentation

## 2017-04-12 DIAGNOSIS — L821 Other seborrheic keratosis: Secondary | ICD-10-CM | POA: Diagnosis not present

## 2017-04-12 DIAGNOSIS — Z923 Personal history of irradiation: Secondary | ICD-10-CM | POA: Diagnosis not present

## 2017-04-12 DIAGNOSIS — N471 Phimosis: Secondary | ICD-10-CM | POA: Insufficient documentation

## 2017-04-12 DIAGNOSIS — K219 Gastro-esophageal reflux disease without esophagitis: Secondary | ICD-10-CM | POA: Diagnosis not present

## 2017-04-12 DIAGNOSIS — Z7982 Long term (current) use of aspirin: Secondary | ICD-10-CM | POA: Diagnosis not present

## 2017-04-12 DIAGNOSIS — Z79890 Hormone replacement therapy (postmenopausal): Secondary | ICD-10-CM | POA: Diagnosis not present

## 2017-04-12 DIAGNOSIS — E039 Hypothyroidism, unspecified: Secondary | ICD-10-CM | POA: Diagnosis not present

## 2017-04-12 HISTORY — PX: CIRCUMCISION: SHX1350

## 2017-04-12 SURGERY — CIRCUMCISION, ADULT
Anesthesia: Monitor Anesthesia Care | Site: Penis

## 2017-04-12 MED ORDER — ONDANSETRON HCL 4 MG/2ML IJ SOLN
4.0000 mg | Freq: Once | INTRAMUSCULAR | Status: AC
  Administered 2017-04-12: 4 mg via INTRAVENOUS

## 2017-04-12 MED ORDER — BUPIVACAINE HCL (PF) 0.25 % IJ SOLN
INTRAMUSCULAR | Status: DC | PRN
  Administered 2017-04-12: 10 mL

## 2017-04-12 MED ORDER — FENTANYL CITRATE (PF) 100 MCG/2ML IJ SOLN: 25.0000 ug | INTRAMUSCULAR | Status: DC | PRN

## 2017-04-12 MED ORDER — LIDOCAINE HCL (PF) 1 % IJ SOLN
INTRAMUSCULAR | Status: AC
  Filled 2017-04-12: qty 30

## 2017-04-12 MED ORDER — DEXAMETHASONE SODIUM PHOSPHATE 4 MG/ML IJ SOLN
4.0000 mg | Freq: Once | INTRAMUSCULAR | Status: AC
  Administered 2017-04-12: 4 mg via INTRAVENOUS

## 2017-04-12 MED ORDER — LIDOCAINE HCL (CARDIAC) 20 MG/ML IV SOLN
INTRAVENOUS | Status: DC | PRN
  Administered 2017-04-12: 30 mg via INTRAVENOUS

## 2017-04-12 MED ORDER — PROPOFOL 10 MG/ML IV BOLUS
INTRAVENOUS | Status: AC
  Filled 2017-04-12: qty 20

## 2017-04-12 MED ORDER — PROPOFOL 10 MG/ML IV BOLUS
INTRAVENOUS | Status: DC | PRN
  Administered 2017-04-12: 150 mg via INTRAVENOUS

## 2017-04-12 MED ORDER — BUPIVACAINE HCL (PF) 0.25 % IJ SOLN
INTRAMUSCULAR | Status: AC
  Filled 2017-04-12: qty 30

## 2017-04-12 MED ORDER — FENTANYL CITRATE (PF) 100 MCG/2ML IJ SOLN
INTRAMUSCULAR | Status: DC | PRN
  Administered 2017-04-12: 25 ug via INTRAVENOUS
  Administered 2017-04-12: 50 ug via INTRAVENOUS

## 2017-04-12 MED ORDER — SODIUM CHLORIDE 0.9 % IR SOLN
Status: DC | PRN
  Administered 2017-04-12: 500 mL

## 2017-04-12 MED ORDER — OXYCODONE-ACETAMINOPHEN 5-325 MG PO TABS: 1.0000 | ORAL_TABLET | ORAL | 0 refills | Status: AC | PRN

## 2017-04-12 MED ORDER — FENTANYL CITRATE (PF) 250 MCG/5ML IJ SOLN
INTRAMUSCULAR | Status: AC
  Filled 2017-04-12: qty 5

## 2017-04-12 MED ORDER — MIDAZOLAM HCL 2 MG/2ML IJ SOLN
INTRAMUSCULAR | Status: AC
  Filled 2017-04-12: qty 2

## 2017-04-12 MED ORDER — EPHEDRINE SULFATE 50 MG/ML IJ SOLN
INTRAMUSCULAR | Status: AC
  Filled 2017-04-12: qty 1

## 2017-04-12 MED ORDER — LACTATED RINGERS IV SOLN
INTRAVENOUS | Status: DC
  Administered 2017-04-12: 10:00:00 via INTRAVENOUS

## 2017-04-12 MED ORDER — EPHEDRINE SULFATE 50 MG/ML IJ SOLN
INTRAMUSCULAR | Status: DC | PRN
  Administered 2017-04-12: 10 mg via INTRAVENOUS

## 2017-04-12 MED ORDER — SULFAMETHOXAZOLE-TRIMETHOPRIM 800-160 MG PO TABS: 1.0000 | ORAL_TABLET | Freq: Two times a day (BID) | ORAL | 0 refills | Status: AC

## 2017-04-12 MED ORDER — CEFAZOLIN SODIUM-DEXTROSE 2-4 GM/100ML-% IV SOLN
2.0000 g | INTRAVENOUS | Status: AC
  Administered 2017-04-12: 2 g via INTRAVENOUS
  Filled 2017-04-12: qty 100

## 2017-04-12 MED ORDER — ONDANSETRON HCL 4 MG/2ML IJ SOLN
INTRAMUSCULAR | Status: AC
  Filled 2017-04-12: qty 2

## 2017-04-12 MED ORDER — MIDAZOLAM HCL 2 MG/2ML IJ SOLN
1.0000 mg | INTRAMUSCULAR | Status: AC
  Administered 2017-04-12: 2 mg via INTRAVENOUS

## 2017-04-12 MED ORDER — LACTATED RINGERS IV SOLN: INTRAVENOUS | Status: DC | PRN

## 2017-04-12 MED ORDER — DEXAMETHASONE SODIUM PHOSPHATE 4 MG/ML IJ SOLN
INTRAMUSCULAR | Status: AC
Start: 2017-04-12 — End: 2017-04-12
  Filled 2017-04-12: qty 1

## 2017-04-12 MED ORDER — LIDOCAINE HCL (PF) 1 % IJ SOLN
INTRAMUSCULAR | Status: AC
  Filled 2017-04-12: qty 5

## 2017-04-12 MED ORDER — SODIUM CHLORIDE 0.9 % IJ SOLN
INTRAMUSCULAR | Status: AC
  Filled 2017-04-12: qty 10

## 2017-04-12 SURGICAL SUPPLY — 26 items
BAG HAMPER (MISCELLANEOUS) ×3 IMPLANT
BANDAGE COBAN STERILE 2 (GAUZE/BANDAGES/DRESSINGS) ×3 IMPLANT
BNDG CONFORM 2 STRL LF (GAUZE/BANDAGES/DRESSINGS) ×3 IMPLANT
COVER SURGICAL LIGHT HANDLE (MISCELLANEOUS) ×6 IMPLANT
DECANTER SPIKE VIAL GLASS SM (MISCELLANEOUS) ×3 IMPLANT
DRSG XEROFORM 1X8 (GAUZE/BANDAGES/DRESSINGS) ×3 IMPLANT
ELECT NEEDLE TIP 2.8 STRL (NEEDLE) ×3 IMPLANT
ELECT REM PT RETURN 9FT ADLT (ELECTROSURGICAL) ×3
ELECTRODE REM PT RTRN 9FT ADLT (ELECTROSURGICAL) ×1 IMPLANT
GLOVE BIO SURGEON STRL SZ7 (GLOVE) ×3 IMPLANT
GLOVE BIO SURGEON STRL SZ8 (GLOVE) ×3 IMPLANT
GLOVE BIOGEL PI IND STRL 7.0 (GLOVE) ×2 IMPLANT
GLOVE BIOGEL PI INDICATOR 7.0 (GLOVE) ×4
GOWN STRL REUS W/ TWL XL LVL3 (GOWN DISPOSABLE) ×1 IMPLANT
GOWN STRL REUS W/TWL XL LVL3 (GOWN DISPOSABLE) ×2
KIT ROOM TURNOVER APOR (KITS) ×6 IMPLANT
MANIFOLD NEPTUNE II (INSTRUMENTS) ×3 IMPLANT
NEEDLE HYPO 25X1 1.5 SAFETY (NEEDLE) ×3 IMPLANT
NS IRRIG 500ML POUR BTL (IV SOLUTION) IMPLANT
PACK MINOR (CUSTOM PROCEDURE TRAY) ×3 IMPLANT
PAD ARMBOARD 7.5X6 YLW CONV (MISCELLANEOUS) ×3 IMPLANT
SET BASIN LINEN APH (SET/KITS/TRAYS/PACK) ×3 IMPLANT
SUT VIC AB 4-0 PS2 27 (SUTURE) ×9 IMPLANT
SYR CONTROL 10ML LL (SYRINGE) IMPLANT
TOWEL OR 17X26 10 PK STRL BLUE (TOWEL DISPOSABLE) ×3 IMPLANT
WATER STERILE IRR 500ML POUR (IV SOLUTION) ×3 IMPLANT

## 2017-04-12 NOTE — Addendum Note (Signed)
Addendum  created 04/12/17 1225 by Mickel Baas, CRNA   Review and Sign - Signed, Sign clinical note

## 2017-04-12 NOTE — Discharge Instructions (Signed)
Circumcision, Adult, Care After  This sheet gives you information about how to care for yourself after your procedure. Your doctor may also give you more specific instructions. If you have problems or questions, contact your doctor.  Follow these instructions at home:  Cut care   Follow instructions from your doctor about how to take care of your cut from surgery (incision). Make sure you:  ? Wash your hands with soap and water before you change your bandage (dressing). If you cannot use soap and water, use hand sanitizer.  ? Change your bandage as told by your doctor.  ? Leave stitches (sutures), skin glue, or skin tape (adhesive) strips in place. They may need to stay in place for 2 weeks or longer. If tape strips get loose and curl up, you may trim the loose edges. Do not remove tape strips completely unless your doctor says it is okay   Check your cut area every day for signs of infection. Check for:  ? More redness, swelling, or pain.  ? More fluid or blood.  ? Warmth.  ? Pus or a bad smell.  Bathing   Do not take baths, swim, or use a hot tub until your doctor says it is okay. Ask your doctor if you can take showers. You may only be allowed to take sponge baths for bathing.   Do not get your cut area wet during the 24 hours after the procedure, or as long as told by your doctor.   When you can shower, do not rub the cut area. Gently pat it dry.  General instructions   Avoid heavy lifting, contact sports, biking, or swimming until your doctor says it is okay.   Do not have sex until your doctor says it is okay.   Take or apply over-the-counter and prescription medicines only as told by your doctor.   Keep all follow-up visits as told by your doctor. This is important.  Contact a doctor if:   Medicine does not help your pain.   You have more redness, swelling, or pain around your cut.   You have more fluid or blood coming from your cut.   Your cut feels warm to the touch.   You have pus or a bad  smell coming from your cut.   You have a fever.  Get help right away if:   You cannot pee (urinate).   It hurts to pee.   Your pain is not helped by medicines.   There is redness, swelling, and soreness that spreads up the shaft of your penis, your thighs, or your lower belly (abdomen).   You have bleeding that does not stop when you press on it.  Summary   Follow instructions from your doctor about how to take care of your cut from surgery (incision).   Check your cut area every day for signs of infection.   Do not have sex until your doctor says it is okay.  This information is not intended to replace advice given to you by your health care provider. Make sure you discuss any questions you have with your health care provider.  Document Released: 09/09/2007 Document Revised: 03/31/2016 Document Reviewed: 03/31/2016  Elsevier Interactive Patient Education  2017 [employer]

## 2017-04-12 NOTE — Anesthesia Preprocedure Evaluation (Addendum)
Anesthesia Evaluation  Patient identified by MRN, date of birth, ID band Patient awake    Reviewed: Allergy & Precautions, NPO status , Patient's Chart, lab work & pertinent test results  Airway Mallampati: I  TM Distance: >3 FB Neck ROM: Full   Comment: Good ROM, larynx supple Dental  (+) Edentulous Upper, Edentulous Lower   Pulmonary neg pulmonary ROS,    breath sounds clear to auscultation       Cardiovascular hypertension, Pt. on medications negative cardio ROS   Rhythm:Regular Rate:Normal     Neuro/Psych negative neurological ROS  negative psych ROS   GI/Hepatic GERD  Controlled and Medicated,  Endo/Other  Hypothyroidism   Renal/GU      Musculoskeletal   Abdominal   Peds  Hematology   Anesthesia Other Findings left neck  radiation therapy for cancer.  Reproductive/Obstetrics                             Anesthesia Physical Anesthesia Plan  ASA: III  Anesthesia Plan: General   Post-op Pain Management:    Induction: Intravenous  PONV Risk Score and Plan:   Airway Management Planned: LMA  Additional Equipment:   Intra-op Plan:   Post-operative Plan: Extubation in OR  Informed Consent: I have reviewed the patients History and Physical, chart, labs and discussed the procedure including the risks, benefits and alternatives for the proposed anesthesia with the patient or authorized representative who has indicated his/her understanding and acceptance.     Plan Discussed with:   Anesthesia Plan Comments:        Anesthesia Quick Evaluation

## 2017-04-12 NOTE — Transfer of Care (Signed)
Immediate Anesthesia Transfer of Care Note  Patient: Jerry Flynn  Procedure(s) Performed: CIRCUMCISION ADULT (N/A Penis)  Patient Location: PACU  Anesthesia Type:General  Level of Consciousness: drowsy and patient cooperative  Airway & Oxygen Therapy: Patient Spontanous Breathing and Patient connected to face mask oxygen  Post-op Assessment: Report given to RN and Post -op Vital signs reviewed and stable  Post vital signs: Reviewed and stable  Last Vitals:  Vitals:   04/12/17 1005 04/12/17 1009  BP:  (!) 130/59  Pulse:    Resp: 15 12  Temp:    SpO2: 100% 100%    Last Pain:  Vitals:   04/12/17 0822  TempSrc: Oral         Complications: No apparent anesthesia complications

## 2017-04-12 NOTE — H&P (Signed)
Urology Admission H&P  Chief Complaint: penile lesion and phimosis  History of Present Illness: [name] is a 66yo with a hx of phimosis who on exam was found to have a preputial lesions. He has difficulty urinating a pain from the lesion  Past Medical History:  Diagnosis Date  . Arthritis   . Cancer [facility])    left neck lymph radiation for cancer  . GERD (gastroesophageal reflux disease)   . Hypothyroidism    Past Surgical History:  Procedure Laterality Date  . PORTACATH PLACEMENT      Home Medications:  Current Facility-Administered Medications  Medication Dose Route Frequency Provider Last Rate Last Dose  . ceFAZolin (ANCEF) IVPB 2g/100 mL premix  2 g Intravenous 30 min Pre-Op Almadelia Looman, Candee Furbish, MD      . lactated ringers infusion   Intravenous Continuous Lerry Liner, MD      . midazolam (VERSED) injection 1-2 mg  1-2 mg Intravenous Q5 min Lerry Liner, MD   2 mg at 04/12/17 7782   Allergies: No Known Allergies  History reviewed. No pertinent family history. Social History:  reports that  has never smoked. he has never used smokeless tobacco. He reports that he drinks alcohol. He reports that he does not use drugs.  ROS  Physical Exam:  Vital signs in last 24 hours: Temp:  [98 F (36.7 C)] 98 F (36.7 C) (01/07 0822) Pulse Rate:  [63] 63 (01/07 0822) Resp:  [10-20] 20 (01/07 0950) BP: (125-147)/(63-73) 147/73 (01/07 0950) SpO2:  [97 %-99 %] 99 % (01/07 0950) Physical Exam  Laboratory Data:  No results found for this or any previous visit (from the past 24 hour(s)). No results found for this or any previous visit (from the past 240 hour(s)). Creatinine: Recent Labs    04/08/17 1142  CREATININE 0.80   Baseline Creatinine: 0.8  Impression/Assessment:  66yo with penile lesion and phimosis  Plan:  The risks/benefits/alternatives to excision of lesion and circumcision was explained to the patient and he understands and wishes to proceed with  surgery  Nicolette Bang 04/12/2017, 9:59 AM

## 2017-04-12 NOTE — Op Note (Signed)
Preoperative diagnosis: phimosis and penile lesion  Postoperative diagnosis: Same  Procedure: circumcision  Attending: Nicolette Bang, MD  Anesthesia: general  History of blood loss: Minimal  Antibiotics: ancef  Drains: none  Specimens: foreskin and penile lesion  Findings: dense phimotic ring. 1.5cm penile lesion concerning for malignancy  Indications: Patient is a 67 year old male with a history of phimosis, difficulty urinating, and recurrent balanoprothitis. He also has a newly diagnosed 1.5cm penile lesion. After discussing treatment options he decided to proceed with circumcision and excision of lesion   Procedure in detail: Prior to procedure consent was obtained.  Patient was brought to the operating room and a brief timeout was done to ensure correct patient, correct procedure, correct site.  General anesthesia was administered and patient was placed in supine position.  His genitalia and abdomen was then prepped and draped in usual sterile fashion.  The phimotic ring was incised sharply and the foreskin was then retracted. An circumferential incision was made 1.5cm below the coronal ridge. We then pulled the foreskin over the glans and made a separate circumferential incision at the level of the coronal ridge below the penile lesion. We then sharply incised the foreskin and then freed it from the dartos using electrocautery. We then sent the foreskin for pathology. Individual bleeders were then cauterized and good hemostasis was noted. We then reapproximated the penile skin to the subcoronal incision. We used interrupted 3-0 vicryl to close the incision. Once this was complete we applied a gauze bandage and this then concluded the procedure which was well tolerated by the patient.  Complications: None  Condition: Stable, extubated, transferred to PACU.  Plan: Patient is to be discharged home.  He is to followup in 2 weeks for a wound check

## 2017-04-12 NOTE — Anesthesia Postprocedure Evaluation (Signed)
Anesthesia Post Note  Patient: Jerry Flynn  Procedure(s) Performed: CIRCUMCISION ADULT (N/A Penis)  Patient location during evaluation: PACU Anesthesia Type: General Level of consciousness: awake and alert, oriented and patient cooperative Pain management: pain level controlled Vital Signs Assessment: post-procedure vital signs reviewed and stable Respiratory status: spontaneous breathing Cardiovascular status: stable Postop Assessment: no apparent nausea or vomiting Anesthetic complications: no     Last Vitals:  Vitals:   04/12/17 1130 04/12/17 1145  BP: 138/67 131/77  Pulse: 66 68  Resp: 14 14  Temp:    SpO2: 100% 98%    Last Pain:  Vitals:   04/12/17 1145  TempSrc:   PainSc: 0-No pain                 Jamielyn Petrucci A

## 2017-04-12 NOTE — Anesthesia Procedure Notes (Signed)
Procedure Name: LMA Insertion Date/Time: 04/12/2017 10:20 AM Performed by: Andree Elk, Lajuan Godbee A, CRNA Pre-anesthesia Checklist: Timeout performed, Patient identified, Emergency Drugs available, Suction available and Patient being monitored Patient Re-evaluated:Patient Re-evaluated prior to induction Oxygen Delivery Method: Circle system utilized Preoxygenation: Pre-oxygenation with 100% oxygen Induction Type: IV induction Ventilation: Mask ventilation without difficulty LMA: LMA inserted LMA Size: 3.0 Number of attempts: 1 Placement Confirmation: positive ETCO2 and breath sounds checked- equal and bilateral Tube secured with: Tape Dental Injury: Teeth and Oropharynx as per pre-operative assessment

## 2017-04-13 ENCOUNTER — Encounter (HOSPITAL_COMMUNITY): Payer: Self-pay | Admitting: Urology

## 2017-05-05 ENCOUNTER — Ambulatory Visit (INDEPENDENT_AMBULATORY_CARE_PROVIDER_SITE_OTHER): Payer: Medicare Other | Admitting: Urology

## 2017-05-05 DIAGNOSIS — N471 Phimosis: Secondary | ICD-10-CM

## 2021-03-10 NOTE — Progress Notes (Shared)
[facility] [address], [place] [address]   CLINIC:  Medical Oncology/Hematology  CONSULT NOTE  Patient Care Team: Lucia Gaskins, MD as PCP - General (Internal Medicine)  CHIEF COMPLAINTS/PURPOSE OF CONSULTATION:  ***  HISTORY OF PRESENTING ILLNESS:  [name] 70 y.o. male is here because of ***, at the request of {REFERRING PHYSICIAN}.  MEDICAL HISTORY:  Past Medical History:  Diagnosis Date   Arthritis    Cancer ([place])    left neck lymph radiation for cancer   GERD (gastroesophageal reflux disease)    Hypothyroidism     SURGICAL HISTORY: Past Surgical History:  Procedure Laterality Date   CIRCUMCISION N/A 04/12/2017   Procedure: CIRCUMCISION ADULT;  Surgeon: Cleon Gustin, MD;  Location: [facility];  Service: Urology;  Laterality: N/A;   PORTACATH PLACEMENT      SOCIAL HISTORY: Social History   Socioeconomic History   Marital status: Significant Other    Spouse name: Not on file   Number of children: Not on file   Years of education: Not on file   Highest education level: Not on file  Occupational History   Not on file  Tobacco Use   Smoking status: Never   Smokeless tobacco: Never  Vaping Use   Vaping Use: Never used  Substance and Sexual Activity   Alcohol use: Yes    Comment: occassional   Drug use: No   Sexual activity: Yes    Birth control/protection: None  Other Topics Concern   Not on file  Social History Narrative   Not on file   Social Determinants of Health   Financial Resource Strain: Not on file  Food Insecurity: Not on file  Transportation Needs: Not on file  Physical Activity: Not on file  Stress: Not on file  Social Connections: Not on file  Intimate Partner Violence: Not on file    FAMILY HISTORY: No family history on file.  ALLERGIES:  has No Known Allergies.  MEDICATIONS:  Current Outpatient Medications  Medication Sig Dispense Refill   amLODipine (NORVASC) 5 MG tablet Take 5 mg  by mouth daily.  5   aspirin EC 81 MG tablet Take 81 mg by mouth daily.     Cholecalciferol (VITAMIN D3 PO) Take 1 capsule by mouth daily.     gabapentin (NEURONTIN) 300 MG capsule Take 300 mg by mouth 2 (two) times daily.  2   levothyroxine (SYNTHROID, LEVOTHROID) 50 MCG tablet Take 50 mcg by mouth daily.  5   Multiple Vitamins-Minerals (MULTIVITAMIN PO) Take 1 tablet by mouth daily.     naproxen (NAPROSYN) 500 MG tablet Take 500 mg by mouth 2 (two) times daily as needed for moderate pain.   5   pantoprazole (PROTONIX) 40 MG tablet Take 40 mg by mouth daily.  5   Pyridoxine HCl (VITAMIN B-6 PO) Take 1 tablet by mouth daily.     sulfamethoxazole-trimethoprim (BACTRIM DS,SEPTRA DS) 800-160 MG tablet Take 1 tablet by mouth 2 (two) times daily. 14 tablet 0   Thiamine HCl (VITAMIN B-1 PO) Take 1 tablet by mouth daily.     No current facility-administered medications for this visit.    REVIEW OF SYSTEMS:   Review of Systems - Oncology   PHYSICAL EXAMINATION: ECOG PERFORMANCE STATUS: {CHL ONC ECOG PS:[phone]}  There were no vitals filed for this visit. There were no vitals filed for this visit. Physical Exam   LABORATORY DATA:  I have reviewed the data as listed CBC  Latest Ref Rng & Units 04/08/2017 05/29/2009 03/20/2009  WBC 4.0 - 10.5 K/uL 8.8 4.4 6.5  Hemoglobin 13.0 - 17.0 g/dL 15.0 13.6 11.9(L)  Hematocrit 39.0 - 52.0 % 45.0 40.3 34.7(L)  Platelets 150 - 400 K/uL 168 99(L) 149   CMP Latest Ref Rng & Units 04/08/2017 05/29/2009 03/18/2009  Glucose 65 - 99 mg/dL 134(H) 131(H) 123(H)  BUN 6 - 20 mg/dL 16 21 20   Creatinine 0.61 - 1.24 mg/dL 0.80 0.67 0.67  Sodium 135 - 145 mmol/L 139 138 135  Potassium 3.5 - 5.1 mmol/L 3.6 4.4 5.0  Chloride 101 - 111 mmol/L 103 101 100  CO2 22 - 32 mmol/L 23 26 27   Calcium 8.9 - 10.3 mg/dL 10.0 9.1 8.9  Total Protein 6.0 - 8.3 g/dL - 7.8 6.7  Total Bilirubin 0.3 - 1.2 mg/dL - 0.4 0.5  Alkaline Phos 39 - 117 U/L - 122(H) 129(H)  AST 0 - 37 U/L -  115(H) 111(H)  ALT 0 - 53 U/L - 109(H) 87(H)    RADIOGRAPHIC STUDIES: I have personally reviewed the radiological images as listed and agreed with the findings in the report. No results found.  ASSESSMENT:  ***   PLAN:  ***   All questions were answered. The patient knows to call the clinic with any problems, questions or concerns.   Derek Jack, MD, 03/10/21 7:51 AM  [place] [phone]   I, ***, am acting as a scribe for Dr. Derek Jack.  {Add Barista Statement}

## 2021-03-11 ENCOUNTER — Inpatient Hospital Stay (HOSPITAL_COMMUNITY): Payer: Medicare Other | Attending: Hematology | Admitting: Hematology

## 2022-12-20 ENCOUNTER — Encounter (HOSPITAL_COMMUNITY): Payer: Self-pay | Admitting: *Deleted

## 2022-12-20 ENCOUNTER — Other Ambulatory Visit: Payer: Self-pay

## 2022-12-20 ENCOUNTER — Emergency Department (HOSPITAL_COMMUNITY)
Admission: EM | Admit: 2022-12-20 | Discharge: 2022-12-20 | Disposition: A | Discharge status: Home / Self Care | Payer: Medicare Other | Attending: Emergency Medicine | Admitting: Emergency Medicine

## 2022-12-20 DIAGNOSIS — R531 Weakness: Secondary | ICD-10-CM | POA: Diagnosis present

## 2022-12-20 DIAGNOSIS — E860 Dehydration: Secondary | ICD-10-CM | POA: Insufficient documentation

## 2022-12-20 DIAGNOSIS — E039 Hypothyroidism, unspecified: Secondary | ICD-10-CM | POA: Diagnosis not present

## 2022-12-20 DIAGNOSIS — Z7982 Long term (current) use of aspirin: Secondary | ICD-10-CM | POA: Diagnosis not present

## 2022-12-20 DIAGNOSIS — Z79899 Other long term (current) drug therapy: Secondary | ICD-10-CM | POA: Insufficient documentation

## 2022-12-20 DIAGNOSIS — C220 Liver cell carcinoma: Secondary | ICD-10-CM | POA: Diagnosis not present

## 2022-12-20 DIAGNOSIS — R748 Abnormal levels of other serum enzymes: Secondary | ICD-10-CM | POA: Diagnosis not present

## 2022-12-20 LAB — CBC WITH DIFFERENTIAL/PLATELET
Abs Immature Granulocytes: 0.02 10*3/uL (ref 0.00–0.07)
Basophils Absolute: 0 10*3/uL (ref 0.0–0.1)
Basophils Relative: 1 %
Eosinophils Absolute: 0 10*3/uL (ref 0.0–0.5)
Eosinophils Relative: 0 %
HCT: 38 % — ABNORMAL LOW (ref 39.0–52.0)
Hemoglobin: 12.7 g/dL — ABNORMAL LOW (ref 13.0–17.0)
Immature Granulocytes: 0 %
Lymphocytes Relative: 23 %
Lymphs Abs: 1.4 10*3/uL (ref 0.7–4.0)
MCH: 27.5 pg (ref 26.0–34.0)
MCHC: 33.4 g/dL (ref 30.0–36.0)
MCV: 82.4 fL (ref 80.0–100.0)
Monocytes Absolute: 0.7 10*3/uL (ref 0.1–1.0)
Monocytes Relative: 11 %
Neutro Abs: 4 10*3/uL (ref 1.7–7.7)
Neutrophils Relative %: 65 %
Platelets: 206 10*3/uL (ref 150–400)
RBC: 4.61 MIL/uL (ref 4.22–5.81)
RDW: 17.2 % — ABNORMAL HIGH (ref 11.5–15.5)
WBC: 6.2 10*3/uL (ref 4.0–10.5)
nRBC: 0 % (ref 0.0–0.2)

## 2022-12-20 LAB — URINALYSIS, W/ REFLEX TO CULTURE (INFECTION SUSPECTED)
Bacteria, UA: NONE SEEN
Bilirubin Urine: NEGATIVE
Glucose, UA: NEGATIVE mg/dL
Hgb urine dipstick: NEGATIVE
Ketones, ur: NEGATIVE mg/dL
Leukocytes,Ua: NEGATIVE
Nitrite: NEGATIVE
Protein, ur: >=300 mg/dL — AB
Specific Gravity, Urine: 1.022 (ref 1.005–1.030)
pH: 6 (ref 5.0–8.0)

## 2022-12-20 LAB — COMPREHENSIVE METABOLIC PANEL
ALT: 100 U/L — ABNORMAL HIGH (ref 0–44)
AST: 120 U/L — ABNORMAL HIGH (ref 15–41)
Albumin: 4.4 g/dL (ref 3.5–5.0)
Alkaline Phosphatase: 586 U/L — ABNORMAL HIGH (ref 38–126)
Anion gap: 10 (ref 5–15)
BUN: 23 mg/dL (ref 8–23)
CO2: 23 mmol/L (ref 22–32)
Calcium: 9.5 mg/dL (ref 8.9–10.3)
Chloride: 98 mmol/L (ref 98–111)
Creatinine, Ser: 0.92 mg/dL (ref 0.61–1.24)
GFR, Estimated: >60 mL/min (ref >60)
Glucose, Bld: 172 mg/dL — ABNORMAL HIGH (ref 70–99)
Potassium: 3.8 mmol/L (ref 3.5–5.1)
Sodium: 131 mmol/L — ABNORMAL LOW (ref 135–145)
Total Bilirubin: 1.3 mg/dL — ABNORMAL HIGH (ref 0.3–1.2)
Total Protein: 8.3 g/dL — ABNORMAL HIGH (ref 6.5–8.1)

## 2022-12-20 LAB — CK: Total CK: 134 U/L (ref 49–397)

## 2022-12-20 LAB — PROTIME-INR
INR: 1 (ref 0.8–1.2)
Prothrombin Time: 13.4 s (ref 11.4–15.2)

## 2022-12-20 LAB — MAGNESIUM: Magnesium: 1.8 mg/dL (ref 1.7–2.4)

## 2022-12-20 MED ORDER — PROCHLORPERAZINE MALEATE 10 MG PO TABS
5.0000 mg | ORAL_TABLET | Freq: Two times a day (BID) | ORAL | 0 refills | Status: AC | PRN
Start: 2022-12-20 — End: ?

## 2022-12-20 MED ORDER — HYDROMORPHONE HCL 1 MG/ML IJ SOLN
0.5000 mg | Freq: Once | INTRAMUSCULAR | Status: AC
  Administered 2022-12-20: 0.5 mg via INTRAVENOUS
  Filled 2022-12-20: qty 0.5

## 2022-12-20 MED ORDER — LACTATED RINGERS IV BOLUS
1000.0000 mL | Freq: Once | INTRAVENOUS | Status: AC
  Administered 2022-12-20: 1000 mL via INTRAVENOUS

## 2022-12-20 MED ORDER — OXYCODONE HCL 5 MG PO TABS
5.0000 mg | ORAL_TABLET | ORAL | 0 refills | Status: AC | PRN
Start: 2022-12-20 — End: ?

## 2022-12-20 MED ORDER — PROCHLORPERAZINE EDISYLATE 10 MG/2ML IJ SOLN
5.0000 mg | Freq: Once | INTRAMUSCULAR | Status: AC
  Administered 2022-12-20: 5 mg via INTRAVENOUS
  Filled 2022-12-20: qty 2

## 2022-12-20 NOTE — ED Triage Notes (Addendum)
Pt with liver cancer, pt states he has had weight loss and generalized weakness.  Seeing [facility] for his cancer treatment but has not started yet.  Trouble with swallowing at times. Also c/o cramping to hands and legs.

## 2022-12-20 NOTE — Discharge Instructions (Addendum)
Follow-up with oncology tomorrow as planned.  It was a pleasure caring for you today in the emergency department.  Please return to the emergency department for any worsening or worrisome symptoms.

## 2022-12-20 NOTE — ED Provider Notes (Signed)
[place] EMERGENCY DEPARTMENT AT [facility] Provider Note   CSN: [id number] Arrival date & time: 12/20/22  1140     History  Chief Complaint  Patient presents with   Weakness    Jerry Flynn is a 72 y.o. male.   Weakness Patient presents with weakness and decreased oral intake.  Sees [facility] for cancer treatment of rediscovered recurrent hepatocellular carcinoma.  Has had outpatient CTs and MRI to further evaluate but had not been read yet.  For the last couple weeks had decreased oral intake.  Not really able to eat and drink much.  States he will have muscle cramps and that he hurts all over.  States he will also sometimes feel as if food gets stuck.  Has gone from 108 down to 102 pounds over the last couple weeks.  Is due to follow-up with oncology tomorrow.    Past Medical History:  Diagnosis Date   Arthritis    Cancer ([facility])    left neck lymph radiation for cancer   GERD (gastroesophageal reflux disease)    Hypothyroidism   History of hepatic cellular carcinoma post recurrence. Past Surgical History:  Procedure Laterality Date   CIRCUMCISION N/A 04/12/2017   Procedure: CIRCUMCISION ADULT;  Surgeon: Malen Gauze, MD;  Location: [facility];  Service: Urology;  Laterality: N/A;   PORTACATH PLACEMENT    Cholecystectomy.   Home Medications Prior to Admission medications   Medication Sig Start Date End Date Taking? Authorizing Provider  prochlorperazine (COMPAZINE) 10 MG tablet Take 0.5-1 tablets (5-10 mg total) by mouth 2 (two) times daily as needed for nausea or vomiting. 12/20/22  Yes Benjiman Core, MD  amLODipine (NORVASC) 5 MG tablet Take 5 mg by mouth daily. 01/20/17   [provider]  aspirin EC 81 MG tablet Take 81 mg by mouth daily.    [provider]  Cholecalciferol (VITAMIN D3 PO) Take 1 capsule by mouth daily.    [provider]  gabapentin (NEURONTIN) 300 MG capsule Take 300 mg by mouth 2 (two) times daily.  03/25/17   [provider]  levothyroxine (SYNTHROID, LEVOTHROID) 50 MCG tablet Take 50 mcg by mouth daily. 03/22/17   [provider]  Multiple Vitamins-Minerals (MULTIVITAMIN PO) Take 1 tablet by mouth daily.    [provider]  naproxen (NAPROSYN) 500 MG tablet Take 500 mg by mouth 2 (two) times daily as needed for moderate pain.  02/10/17   [provider]  pantoprazole (PROTONIX) 40 MG tablet Take 40 mg by mouth daily. 03/22/17   [provider]  Pyridoxine HCl (VITAMIN B-6 PO) Take 1 tablet by mouth daily.    [provider]  sulfamethoxazole-trimethoprim (BACTRIM DS,SEPTRA DS) 800-160 MG tablet Take 1 tablet by mouth 2 (two) times daily. 04/12/17   McKenzie, Mardene Celeste, MD  Thiamine HCl (VITAMIN B-1 PO) Take 1 tablet by mouth daily.    [provider]      Allergies    Patient has no known allergies.    Review of Systems   Review of Systems  Neurological:  Positive for weakness.    Physical Exam Updated Vital Signs BP (!) 179/73 (BP Location: Right Arm)   Pulse 78   Temp 98.1 F (36.7 C) (Oral)   Resp 12   Ht 5\' 5"  (1.651 m)   Wt 46.3 kg   SpO2 98%   BMI 16.97 kg/m  Physical Exam Vitals reviewed.  Constitutional:      Appearance: He is  not toxic-appearing or diaphoretic.     Comments: Somewhat cachectic  Cardiovascular:     Rate and Rhythm: Normal rate.  Abdominal:     General: There is no distension.  Musculoskeletal:        General: No tenderness.  Neurological:     Mental Status: He is alert and oriented to person, place, and time.     ED Results / Procedures / Treatments   Labs (all labs ordered are listed, but only abnormal results are displayed) Labs Reviewed  COMPREHENSIVE METABOLIC PANEL - Abnormal; Notable for the following components:      Result Value   Sodium 131 (*)    Glucose, Bld 172 (*)    Total Protein 8.3 (*)    AST 120 (*)    ALT 100 (*)    Alkaline Phosphatase 586 (*)     Total Bilirubin 1.3 (*)    All other components within normal limits  CBC WITH DIFFERENTIAL/PLATELET - Abnormal; Notable for the following components:   Hemoglobin 12.7 (*)    HCT 38.0 (*)    RDW 17.2 (*)    All other components within normal limits  URINALYSIS, W/ REFLEX TO CULTURE (INFECTION SUSPECTED) - Abnormal; Notable for the following components:   Color, Urine AMBER (*)    Protein, ur >=300 (*)    All other components within normal limits  PROTIME-INR  MAGNESIUM  CK    EKG None  Radiology No results found.  Procedures Procedures    Medications Ordered in ED Medications  lactated ringers bolus 1,000 mL (1,000 mLs Intravenous New Bag/Given 12/20/22 1243)  HYDROmorphone (DILAUDID) injection 0.5 mg (0.5 mg Intravenous Given 12/20/22 1324)  prochlorperazine (COMPAZINE) injection 5 mg (5 mg Intravenous Given 12/20/22 1348)    ED Course/ Medical Decision Making/ A&P                                 Medical Decision Making Amount and/or Complexity of Data Reviewed Labs: ordered.  Risk Prescription drug management.   Patient generalized weakness muscle aches.  Decreased oral intake.  Known recurrent hepatocellular carcinoma.  Was unable to get spinal MRI done but did have MRI of the abdomen and had his chest CT done although reads are not back yet.  Get basic blood work and give fluid bolus.  Differential diagnose includes dehydration, infection, liver failure.  Blood work reassuring considering overall poor health.  Feeling somewhat better with some fluids.  Will give oral trial.  Feeling better with Compazine.  Likely discharge home for follow-up tomorrow with oncology.  Care turned over to Dr. Wallace Cullens        Final Clinical Impression(s) / ED Diagnoses Final diagnoses:  Dehydration    Rx / DC Orders ED Discharge Orders          Ordered    prochlorperazine (COMPAZINE) 10 MG tablet  2 times daily PRN        12/20/22 1516              Benjiman Core, MD 12/20/22 1517

## 2022-12-20 NOTE — ED Provider Notes (Signed)
  Provider Note MRN:  [id number]  Arrival date & time: 12/20/22    ED Course and Medical Decision Making  Assumed care from [place] at shift change.  See note from prior team for complete details, in brief:  72 yo male Hx HCC, seeing oncology tomorrow No relief with home pain meds Relief with compazine Getting fluids here Po chall to dispo  Plan per prior physician po challenge  On recheck he is feeling much better.  He is tolerant p.o. difficulty.  He has appoint with oncology tomorrow.  Reports that pain medication at home was not working well, will trial oxycodone.  Compazine prescribed by prior team.  Encourage bland diet, rehydration at home.  Family to take him home  The patient improved significantly and was discharged in stable condition. Detailed discussions were had with the patient regarding current findings, and need for close f/u with PCP or on call doctor. The patient has been instructed to return immediately if the symptoms worsen in any way for re-evaluation. Patient verbalized understanding and is in agreement with current care plan. All questions answered prior to discharge.    Procedures  Final Clinical Impressions(s) / ED Diagnoses     ICD-10-CM   1. Dehydration  E86.0     2. Elevated liver enzymes  R74.8       ED Discharge Orders          Ordered    prochlorperazine (COMPAZINE) 10 MG tablet  2 times daily PRN        12/20/22 1516    oxyCODONE (ROXICODONE) 5 MG immediate release tablet  Every 4 hours PRN        12/20/22 1643              Discharge Instructions      Follow-up with oncology tomorrow as planned.  It was a pleasure caring for you today in the emergency department.  Please return to the emergency department for any worsening or worrisome symptoms.        Sloan Leiter, DO 12/20/22 1644

## 2022-12-23 ENCOUNTER — Other Ambulatory Visit (HOSPITAL_COMMUNITY): Payer: Self-pay | Admitting: Hematology and Oncology

## 2022-12-23 DIAGNOSIS — C220 Liver cell carcinoma: Secondary | ICD-10-CM

## 2023-01-07 ENCOUNTER — Ambulatory Visit (HOSPITAL_COMMUNITY)
Admission: RE | Admit: 2023-01-07 | Discharge: 2023-01-07 | Disposition: A | Discharge status: Home / Self Care | Payer: Medicare Other | Source: Ambulatory Visit | Attending: Hematology and Oncology | Admitting: Hematology and Oncology

## 2023-01-07 DIAGNOSIS — C220 Liver cell carcinoma: Secondary | ICD-10-CM

## 2023-01-07 MED ORDER — GADOBUTROL 1 MMOL/ML IV SOLN
5.0000 mL | Freq: Once | INTRAVENOUS | Status: AC | PRN
  Administered 2023-01-07: 5 mL via INTRAVENOUS
